# Patient Record
Sex: Male | Born: 1985 | Race: Black or African American | Hispanic: No | Marital: Single | Smoking: Current every day smoker
Health system: Southern US, Community
[De-identification: ages and names within clinical notes are randomized; demographics above are authoritative.]

---

## 2012-05-24 ENCOUNTER — Emergency Department (HOSPITAL_COMMUNITY)
Admission: EM | Admit: 2012-05-24 | Discharge: 2012-05-25 | Disposition: A | Discharge status: Home / Self Care | Payer: Self-pay | Attending: Emergency Medicine | Admitting: Emergency Medicine

## 2012-05-24 ENCOUNTER — Encounter (HOSPITAL_COMMUNITY): Payer: Self-pay | Admitting: *Deleted

## 2012-05-24 DIAGNOSIS — Y9210 Unspecified residential institution as the place of occurrence of the external cause: Secondary | ICD-10-CM | POA: Insufficient documentation

## 2012-05-24 DIAGNOSIS — S0003XA Contusion of scalp, initial encounter: Secondary | ICD-10-CM | POA: Insufficient documentation

## 2012-05-24 DIAGNOSIS — S0083XA Contusion of other part of head, initial encounter: Secondary | ICD-10-CM

## 2012-05-24 DIAGNOSIS — Y9389 Activity, other specified: Secondary | ICD-10-CM | POA: Insufficient documentation

## 2012-05-24 DIAGNOSIS — Z87891 Personal history of nicotine dependence: Secondary | ICD-10-CM | POA: Insufficient documentation

## 2012-05-24 DIAGNOSIS — S0180XA Unspecified open wound of other part of head, initial encounter: Secondary | ICD-10-CM | POA: Insufficient documentation

## 2012-05-24 DIAGNOSIS — R55 Syncope and collapse: Secondary | ICD-10-CM | POA: Insufficient documentation

## 2012-05-24 DIAGNOSIS — S0181XA Laceration without foreign body of other part of head, initial encounter: Secondary | ICD-10-CM

## 2012-05-24 DIAGNOSIS — W1809XA Striking against other object with subsequent fall, initial encounter: Secondary | ICD-10-CM | POA: Insufficient documentation

## 2012-05-24 DIAGNOSIS — R404 Transient alteration of awareness: Secondary | ICD-10-CM | POA: Insufficient documentation
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
## 2012-05-25 ENCOUNTER — Emergency Department (HOSPITAL_COMMUNITY): Payer: Self-pay

## 2012-05-25 ENCOUNTER — Encounter (HOSPITAL_COMMUNITY): Payer: Self-pay

## 2012-05-25 MED ORDER — LIDOCAINE HCL (PF) 1 % IJ SOLN
INTRAMUSCULAR | Status: AC
  Filled 2012-05-25: qty 5

## 2012-05-25 NOTE — ED Notes (Signed)
Fully alert, oriented, resting quietly on stretcher

## 2014-04-02 ENCOUNTER — Encounter (HOSPITAL_COMMUNITY): Payer: Self-pay | Admitting: *Deleted

## 2014-04-02 ENCOUNTER — Emergency Department (HOSPITAL_COMMUNITY): Payer: Self-pay

## 2014-04-02 ENCOUNTER — Emergency Department (HOSPITAL_COMMUNITY)
Admission: EM | Admit: 2014-04-02 | Discharge: 2014-04-02 | Disposition: A | Discharge status: Home / Self Care | Payer: Self-pay | Attending: Emergency Medicine | Admitting: Emergency Medicine

## 2014-04-02 DIAGNOSIS — W1842XA Slipping, tripping and stumbling without falling due to stepping into hole or opening, initial encounter: Secondary | ICD-10-CM | POA: Insufficient documentation

## 2014-04-02 DIAGNOSIS — S92352A Displaced fracture of fifth metatarsal bone, left foot, initial encounter for closed fracture: Secondary | ICD-10-CM | POA: Insufficient documentation

## 2014-04-02 DIAGNOSIS — S82302A Unspecified fracture of lower end of left tibia, initial encounter for closed fracture: Secondary | ICD-10-CM

## 2014-04-02 DIAGNOSIS — Y9389 Activity, other specified: Secondary | ICD-10-CM | POA: Insufficient documentation

## 2014-04-02 DIAGNOSIS — Y998 Other external cause status: Secondary | ICD-10-CM | POA: Insufficient documentation

## 2014-04-02 DIAGNOSIS — Z720 Tobacco use: Secondary | ICD-10-CM | POA: Insufficient documentation

## 2014-04-02 DIAGNOSIS — S8252XA Displaced fracture of medial malleolus of left tibia, initial encounter for closed fracture: Secondary | ICD-10-CM

## 2014-04-02 DIAGNOSIS — S8255XA Nondisplaced fracture of medial malleolus of left tibia, initial encounter for closed fracture: Secondary | ICD-10-CM | POA: Insufficient documentation

## 2014-04-02 DIAGNOSIS — Y9289 Other specified places as the place of occurrence of the external cause: Secondary | ICD-10-CM | POA: Insufficient documentation

## 2014-04-02 LAB — BASIC METABOLIC PANEL
ANION GAP: 7 (ref 5–15)
BUN: 13 mg/dL (ref 6–23)
CALCIUM: 9.4 mg/dL (ref 8.4–10.5)
CO2: 28 mmol/L (ref 19–32)
Chloride: 104 mmol/L (ref 96–112)
Creatinine, Ser: 1.09 mg/dL (ref 0.50–1.35)
GFR calc Af Amer: >90 mL/min (ref >90)
Glucose, Bld: 102 mg/dL — ABNORMAL HIGH (ref 70–99)
POTASSIUM: 4.1 mmol/L (ref 3.5–5.1)
SODIUM: 139 mmol/L (ref 135–145)

## 2014-04-02 LAB — CBC WITH DIFFERENTIAL/PLATELET
BASOS PCT: 0 % (ref 0–1)
Basophils Absolute: 0 10*3/uL (ref 0.0–0.1)
EOS ABS: 0.1 10*3/uL (ref 0.0–0.7)
EOS PCT: 1 % (ref 0–5)
HCT: 44.3 % (ref 39.0–52.0)
Hemoglobin: 15.4 g/dL (ref 13.0–17.0)
LYMPHS ABS: 2.3 10*3/uL (ref 0.7–4.0)
Lymphocytes Relative: 22 % (ref 12–46)
MCH: 29.3 pg (ref 26.0–34.0)
MCHC: 34.8 g/dL (ref 30.0–36.0)
MCV: 84.4 fL (ref 78.0–100.0)
MONOS PCT: 10 % (ref 3–12)
Monocytes Absolute: 1 10*3/uL (ref 0.1–1.0)
NEUTROS PCT: 68 % (ref 43–77)
Neutro Abs: 7 10*3/uL (ref 1.7–7.7)
PLATELETS: 213 10*3/uL (ref 150–400)
RBC: 5.25 MIL/uL (ref 4.22–5.81)
RDW: 13.5 % (ref 11.5–15.5)
WBC: 10.3 10*3/uL (ref 4.0–10.5)

## 2014-04-02 MED ORDER — OXYCODONE-ACETAMINOPHEN 5-325 MG PO TABS: 1.0000 | ORAL_TABLET | Freq: Four times a day (QID) | ORAL | Status: DC | PRN

## 2014-04-02 MED ORDER — ONDANSETRON HCL 4 MG PO TABS
4.0000 mg | ORAL_TABLET | Freq: Once | ORAL | Status: AC
  Administered 2014-04-02: 4 mg via ORAL
  Filled 2014-04-02: qty 1

## 2014-04-02 MED ORDER — HYDROMORPHONE HCL 2 MG/ML IJ SOLN
2.0000 mg | Freq: Once | INTRAMUSCULAR | Status: AC
  Administered 2014-04-02: 2 mg via INTRAMUSCULAR
  Filled 2014-04-02: qty 1

## 2014-04-02 MED ORDER — CEFAZOLIN SODIUM 1-5 GM-% IV SOLN: 1.0000 g | Freq: Once | INTRAVENOUS | Status: DC

## 2014-04-02 NOTE — Discharge Instructions (Signed)
Please keep your left foot and ankle elevated above your waist. Please apply ice. Usual crutches, and do not put weight on your left ankle. You're scheduled for surgery on Wednesday. Please follow the instructions from Dr. Hilda LiasKeeling. Tibial and Fibular Fracture, Adult Tibial and fibular fracture is a break in the bones of your lower leg (tibia and fibula). The tibia is the larger of these two bones. The fibula is the smaller of the two bones. It is on the outer side of your leg.  CAUSES  Low-energy injuries, such as a fall from ground level.  High-energy injuries, such as motor vehicle injuries, gunshot wounds, or high-speed sports collisions. RISK FACTORS  Jumping activities.  Repetitive stress, such as long-distance running.  Participation in sports.  Osteoporosis.  Advanced age. SIGNS AND SYMPTOMS  Pain.  Swelling.  Inability to put weight on your injured leg.  Bone deformities at the site of your injury.  Bruising. DIAGNOSIS  Tibial and fibular fractures are diagnosed with the use of X-ray exams. TREATMENT  If you have a simple fracture of these two bones, they can be treated with simple immobilization. A cast or splint will be used on your leg to keep it from moving while it heals. Then you can begin range-of-motion exercises to regain your knee motion. HOME CARE INSTRUCTIONS   Apply ice to your leg:  Put ice in a plastic bag.  Place a towel between your skin and the bag.  Leave the ice on for 20 minutes, 2-3 times a day.  If you have a plaster or fiberglass cast:  Do not try to scratch the skin under the cast using sharp or pointed objects.  Check the skin around the cast every day. You may put lotion on any red or sore areas.  Keep your cast dry and clean.  If you have a plaster splint:  Wear the splint as directed.  You may loosen the elastic around the splint if your toes become numb, tingle, or turn cold or blue.  Do not put pressure on any part of  your cast or splint until it is fully hardened, because it may deform.  Your cast or splint can be protected during bathing with a plastic bag. Do not lower the cast or splint into water.  Use crutches as directed.  Only take over-the-counter or prescription medicines for pain, discomfort, or fever as directed by your health care provider.  Follow all instructions given to you by your health care provider.  Make and keep all follow-up appointments. SEEK MEDICAL CARE IF:  Your pain is becoming worse rather than better or is not controlled with medicines.  You have increased swelling or redness in the foot.  You begin to lose feeling in your foot or toes. SEEK IMMEDIATE MEDICAL CARE IF:  You develop a cold or blue foot or toes on the injured side.  You develop severe pain in your injured leg, especially if the pain is increased with movement of your toes. MAKE SURE YOU:  Understand these instructions.  Will watch your condition.  Will get help right away if you are not doing well or get worse. Document Released: 09/20/2001 Document Revised: 10/19/2012 Document Reviewed: 08/10/2012 [facility] Patient Information 2015 [employer], [place]. This information is not intended to replace advice given to you by your health care provider. Make sure you discuss any questions you have with your health care provider.

## 2014-04-02 NOTE — ED Notes (Signed)
Pt verbalized understanding of no driving and to use caution within 4 hours of taking pain meds due to meds cause drowsiness 

## 2014-04-02 NOTE — H&P (Deleted)
  The note originally documented on this encounter has been moved the the encounter in which it belongs.  
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
## 2014-04-02 NOTE — ED Provider Notes (Signed)
CSN: [id number]     Arrival date & time 04/02/14  1302 History  This chart was scribed for Ivery Quale, PA-C with Bethann Berkshire, MD by Tonye Royalty, ED Scribe. This patient was seen in room APFT23/APFT23 and the patient's care was started at 2:40 PM.    Chief Complaint  Patient presents with  . Ankle Injury   Patient is a 29 y.o. male presenting with lower extremity injury. The history is provided by the patient. No language interpreter was used.  Ankle Injury This is a new problem. The current episode started 6 to 12 hours ago. Episode frequency: once. The problem has not changed since onset.Pertinent negatives include no chest pain. The symptoms are aggravated by bending. Nothing relieves the symptoms. He has tried nothing for the symptoms.    HPI Comments: [name] [name] is a 29 y.o. male who presents to the Emergency Department complaining of left ankle pain after stepping in a hole at 0600 this morning. He states pain and swelling began immediately. He denies any prior injury or surgery to the area.  History reviewed. No pertinent past medical history. History reviewed. No pertinent past surgical history. History reviewed. No pertinent family history. History  Substance Use Topics  . Smoking status: Current Every Day Smoker  . Smokeless tobacco: Not on file  . Alcohol Use: Yes    Review of Systems  Cardiovascular: Negative for chest pain.  Musculoskeletal:       Left ankle pain and swelling  Neurological: Negative for numbness.  All other systems reviewed and are negative.     Allergies  Review of patient's allergies indicates no known allergies.  Home Medications   Prior to Admission medications   Not on File   BP 158/105 mmHg  Pulse 89  Temp(Src) 98.6 F (37 C) (Oral)  Resp 18  Ht  (1.803 m)  Wt 255 lb (115.667 kg)  BMI 35.58 kg/m2  SpO2 100% Physical Exam  Constitutional: He is oriented to person, place, and time. He appears well-developed and  well-nourished.  HENT:  Head: Normocephalic and atraumatic.  Eyes: Conjunctivae are normal.  Neck: Normal range of motion. Neck supple.  Cardiovascular: Normal rate, regular rhythm and normal heart sounds.   No murmur heard. Pulmonary/Chest: Effort normal and breath sounds normal. No respiratory distress. He has no wheezes. He has no rales.  Musculoskeletal: Normal range of motion.  Cap refill of left foot <2 seconds Swelling of all toes No lesions between the toes Dorsalis pedis is 2+ Tenderness to the lateral malleolus Swelling of the lateral and medial malleolus Tenderness to mid anterior tibia on the left No deformity of the anterior tibial tuberosity No effusion of the knee No posterior mass Hip is stable  Neurological: He is alert and oriented to person, place, and time.  Skin: Skin is warm and dry.  Psychiatric: He has a normal mood and affect.  Nursing note and vitals reviewed.   ED Course  62 - Dr Hilda Lias in room with patient.  Procedures (including critical care time)  DIAGNOSTIC STUDIES: Oxygen Saturation is 100% on room air, normal by my interpretation.    COORDINATION OF CARE: 2:46 PM Reviewed the x-ray results with the patient which revealed evidence of fractures. Discussed treatment plan with patient at beside, including follow up with orthopedist. The patient agrees with the plan and has no further questions at this time.  2:47 PM Call made to Dr. Hilda Lias.  Labs Review Labs Reviewed - No data to display  Imaging Review Dg Ankle Complete Left  04/02/2014   CLINICAL DATA:  Stepped in hole this morning with ankle pain, initial encounter  EXAM: LEFT ANKLE COMPLETE - 3+ VIEW  COMPARISON:  None.  FINDINGS: There is are vertical fracture identified extending from the tibiotalar articulation superiorly into the talus with slight displacement of the entire medial malleolus. No dislocation is noted. Some displacement of bony fragments anterior to the tibiotalar  joint are noted. A small avulsion from the tip of the lateral malleolus is noted. Additionally, a fracture through the base of the fifth metatarsal is noted. Generalized soft tissue swelling is noted.  IMPRESSION: Distal tibial and fibular fractures as described. A fracture through the base of the fifth metatarsal is noted as well.   Electronically Signed   By: Alcide CleverMark  Lukens M.D.   On: 04/02/2014 14:10   Dg Foot Complete Left  04/02/2014   CLINICAL DATA:  Stepped in hole with ankle pain and swelling, initial encounter  EXAM: LEFT FOOT - COMPLETE 3+ VIEW  COMPARISON:  None.  FINDINGS: The known distal tibial fractures are again seen. Some displacement of fracture fragment anterior to the tibiotalar articulation is noted. Fracture through the base of the fifth metatarsal is again seen without significant displacement.  IMPRESSION: Fractures of the distal tibia and fibula as well as the fifth metatarsal. CT evaluation may be helpful for better characterization of the bony fragments.   Electronically Signed   By: Alcide CleverMark  Lukens M.D.   On: 04/02/2014 14:11     EKG Interpretation None      MDM  Blood pressure is 158/105, otherwise the vital signs are within normal limits. I have reviewed the x-rays. The patient has a distal fibula and distal tibial fracture. There is a bone segment that is displaced just anterior to the talar area. There is a fracture at the base of the fifth metatarsal.  Patient has been seen by Dr. Hilda LiasKeeling, and the patient will have surgery on Wednesday, March 23. Prescription for Percocet one or 2 every 6 hours given to the patient. The patient has been fitted with a posterior splint and crutches..    Final diagnoses:  None    *I have reviewed nursing notes, vital signs, and all appropriate lab and imaging results for this patient.*  **I personally performed the services described in this documentation, which was scribed in my presence. The recorded information has been reviewed and  is accurate.Ivery Quale*   Liisa Picone, PA-C 04/02/14 1611  Bethann BerkshireJoseph Zammit, MD 04/05/14 1341

## 2014-04-02 NOTE — ED Notes (Signed)
OR called and stated that consent will be done the day of surgery
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
## 2014-04-02 NOTE — ED Notes (Signed)
Stepped in a hole this am.  Pain, swelling lt ankle and foot

## 2014-04-03 ENCOUNTER — Encounter (HOSPITAL_COMMUNITY): Payer: Self-pay

## 2014-04-03 ENCOUNTER — Encounter (HOSPITAL_COMMUNITY)
Admit: 2014-04-03 | Discharge: 2014-04-03 | Disposition: A | Discharge status: Home / Self Care | Payer: Self-pay | Attending: Orthopaedic Surgery | Admitting: Orthopaedic Surgery

## 2014-04-04 ENCOUNTER — Ambulatory Visit (HOSPITAL_COMMUNITY): Payer: Self-pay | Admitting: Anesthesiology

## 2014-04-04 ENCOUNTER — Ambulatory Visit (HOSPITAL_COMMUNITY): Payer: Self-pay

## 2014-04-04 ENCOUNTER — Encounter (HOSPITAL_COMMUNITY)
Admission: RE | Disposition: A | Discharge status: Home / Self Care | Payer: Self-pay | Source: Ambulatory Visit | Attending: Orthopaedic Surgery

## 2014-04-04 ENCOUNTER — Ambulatory Visit (HOSPITAL_COMMUNITY)
Admission: RE | Admit: 2014-04-04 | Discharge: 2014-04-05 | Disposition: A | Discharge status: Home / Self Care | Payer: Self-pay | Source: Ambulatory Visit | Attending: Orthopaedic Surgery | Admitting: Orthopaedic Surgery

## 2014-04-04 ENCOUNTER — Encounter (HOSPITAL_COMMUNITY): Payer: Self-pay | Admitting: *Deleted

## 2014-04-04 DIAGNOSIS — Y929 Unspecified place or not applicable: Secondary | ICD-10-CM | POA: Insufficient documentation

## 2014-04-04 DIAGNOSIS — F17210 Nicotine dependence, cigarettes, uncomplicated: Secondary | ICD-10-CM | POA: Insufficient documentation

## 2014-04-04 DIAGNOSIS — S92352A Displaced fracture of fifth metatarsal bone, left foot, initial encounter for closed fracture: Secondary | ICD-10-CM | POA: Insufficient documentation

## 2014-04-04 DIAGNOSIS — X58XXXA Exposure to other specified factors, initial encounter: Secondary | ICD-10-CM | POA: Insufficient documentation

## 2014-04-04 DIAGNOSIS — S82899A Other fracture of unspecified lower leg, initial encounter for closed fracture: Secondary | ICD-10-CM

## 2014-04-04 DIAGNOSIS — S82842A Displaced bimalleolar fracture of left lower leg, initial encounter for closed fracture: Secondary | ICD-10-CM | POA: Diagnosis present

## 2014-04-04 HISTORY — PX: ORIF ANKLE FRACTURE: SHX5408

## 2014-04-04 SURGERY — OPEN REDUCTION INTERNAL FIXATION (ORIF) ANKLE FRACTURE
Anesthesia: General | Laterality: Left

## 2014-04-04 MED ORDER — DEXTROSE-NACL 5-0.45 % IV SOLN
INTRAVENOUS | Status: DC
  Administered 2014-04-04: 16:00:00 via INTRAVENOUS

## 2014-04-04 MED ORDER — FENTANYL CITRATE 0.05 MG/ML IJ SOLN
INTRAMUSCULAR | Status: AC
  Filled 2014-04-04: qty 5

## 2014-04-04 MED ORDER — HYDROMORPHONE HCL 1 MG/ML IJ SOLN
0.2500 mg | INTRAMUSCULAR | Status: DC | PRN
  Administered 2014-04-04 (×2): 0.5 mg via INTRAVENOUS

## 2014-04-04 MED ORDER — LACTATED RINGERS IV SOLN
INTRAVENOUS | Status: DC
  Administered 2014-04-04: 1000 mL via INTRAVENOUS

## 2014-04-04 MED ORDER — ENOXAPARIN SODIUM 40 MG/0.4ML ~~LOC~~ SOLN: 40.0000 mg | SUBCUTANEOUS | Status: DC

## 2014-04-04 MED ORDER — ONDANSETRON HCL 4 MG/2ML IJ SOLN
INTRAMUSCULAR | Status: DC | PRN
  Administered 2014-04-04: 4 mg via INTRAVENOUS

## 2014-04-04 MED ORDER — NALOXONE HCL 0.4 MG/ML IJ SOLN: 0.4000 mg | INTRAMUSCULAR | Status: DC | PRN

## 2014-04-04 MED ORDER — MIDAZOLAM HCL 2 MG/2ML IJ SOLN
1.0000 mg | INTRAMUSCULAR | Status: DC | PRN
  Administered 2014-04-04 (×2): 2 mg via INTRAVENOUS
  Filled 2014-04-04: qty 2

## 2014-04-04 MED ORDER — SODIUM CHLORIDE 0.9 % IJ SOLN: 9.0000 mL | INTRAMUSCULAR | Status: DC | PRN

## 2014-04-04 MED ORDER — FENTANYL CITRATE 0.05 MG/ML IJ SOLN
25.0000 ug | INTRAMUSCULAR | Status: AC
  Administered 2014-04-04 (×2): 25 ug via INTRAVENOUS

## 2014-04-04 MED ORDER — ONDANSETRON HCL 4 MG/2ML IJ SOLN
4.0000 mg | Freq: Once | INTRAMUSCULAR | Status: DC | PRN
Start: 2014-04-04 — End: 2014-04-04

## 2014-04-04 MED ORDER — EPHEDRINE SULFATE 50 MG/ML IJ SOLN
INTRAMUSCULAR | Status: AC
  Filled 2014-04-04: qty 1

## 2014-04-04 MED ORDER — FENTANYL CITRATE 0.05 MG/ML IJ SOLN
INTRAMUSCULAR | Status: AC
  Filled 2014-04-04: qty 2

## 2014-04-04 MED ORDER — SODIUM CHLORIDE 0.9 % IR SOLN
Status: DC | PRN
  Administered 2014-04-04: 1000 mL

## 2014-04-04 MED ORDER — ONDANSETRON HCL 4 MG/2ML IJ SOLN
INTRAMUSCULAR | Status: AC
  Filled 2014-04-04: qty 2

## 2014-04-04 MED ORDER — ACETAMINOPHEN 325 MG PO TABS: 650.0000 mg | ORAL_TABLET | Freq: Four times a day (QID) | ORAL | Status: DC | PRN

## 2014-04-04 MED ORDER — NICOTINE 21 MG/24HR TD PT24
21.0000 mg | MEDICATED_PATCH | Freq: Every day | TRANSDERMAL | Status: DC
  Administered 2014-04-04: 21 mg via TRANSDERMAL
  Filled 2014-04-04: qty 1

## 2014-04-04 MED ORDER — MORPHINE SULFATE (PF) 1 MG/ML IV SOLN
INTRAVENOUS | Status: DC
  Administered 2014-04-04 – 2014-04-05 (×4): via INTRAVENOUS
  Filled 2014-04-04 (×3): qty 25

## 2014-04-04 MED ORDER — INFLUENZA VAC SPLIT QUAD 0.5 ML IM SUSY
0.5000 mL | PREFILLED_SYRINGE | INTRAMUSCULAR | Status: AC
  Administered 2014-04-05: 0.5 mL via INTRAMUSCULAR
  Filled 2014-04-04: qty 0.5

## 2014-04-04 MED ORDER — DEXAMETHASONE SODIUM PHOSPHATE 4 MG/ML IJ SOLN
INTRAMUSCULAR | Status: DC | PRN
  Administered 2014-04-04: 4 mg via INTRAVENOUS

## 2014-04-04 MED ORDER — SODIUM CHLORIDE 0.9 % IJ SOLN
INTRAMUSCULAR | Status: AC
  Filled 2014-04-04: qty 10

## 2014-04-04 MED ORDER — HYDROMORPHONE HCL 1 MG/ML IJ SOLN
INTRAMUSCULAR | Status: AC
  Filled 2014-04-04: qty 1

## 2014-04-04 MED ORDER — ONDANSETRON HCL 4 MG/2ML IJ SOLN: 4.0000 mg | Freq: Four times a day (QID) | INTRAMUSCULAR | Status: DC | PRN

## 2014-04-04 MED ORDER — DEXAMETHASONE SODIUM PHOSPHATE 4 MG/ML IJ SOLN
INTRAMUSCULAR | Status: AC
  Filled 2014-04-04: qty 1

## 2014-04-04 MED ORDER — ONDANSETRON HCL 4 MG/2ML IJ SOLN
4.0000 mg | Freq: Once | INTRAMUSCULAR | Status: AC
  Administered 2014-04-04: 4 mg via INTRAVENOUS

## 2014-04-04 MED ORDER — FENTANYL CITRATE 0.05 MG/ML IJ SOLN
INTRAMUSCULAR | Status: DC | PRN
  Administered 2014-04-04 (×2): 50 ug via INTRAVENOUS
  Administered 2014-04-04: 100 ug via INTRAVENOUS
  Administered 2014-04-04: 50 ug via INTRAVENOUS

## 2014-04-04 MED ORDER — EPHEDRINE SULFATE 50 MG/ML IJ SOLN
INTRAMUSCULAR | Status: DC | PRN
  Administered 2014-04-04 (×3): 10 mg via INTRAVENOUS

## 2014-04-04 MED ORDER — PNEUMOCOCCAL VAC POLYVALENT 25 MCG/0.5ML IJ INJ
0.5000 mL | INJECTION | INTRAMUSCULAR | Status: AC
  Administered 2014-04-05: 0.5 mL via INTRAMUSCULAR
  Filled 2014-04-04: qty 0.5

## 2014-04-04 MED ORDER — BUPIVACAINE HCL (PF) 0.25 % IJ SOLN
INTRAMUSCULAR | Status: AC
  Filled 2014-04-04: qty 30

## 2014-04-04 MED ORDER — FENTANYL CITRATE 0.05 MG/ML IJ SOLN
25.0000 ug | INTRAMUSCULAR | Status: AC | PRN
  Administered 2014-04-04 (×2): 25 ug via INTRAVENOUS

## 2014-04-04 MED ORDER — DIPHENHYDRAMINE HCL 50 MG/ML IJ SOLN: 12.5000 mg | Freq: Four times a day (QID) | INTRAMUSCULAR | Status: DC | PRN

## 2014-04-04 MED ORDER — PHENYLEPHRINE HCL 10 MG/ML IJ SOLN
INTRAMUSCULAR | Status: DC | PRN
  Administered 2014-04-04 (×2): 100 ug via INTRAVENOUS

## 2014-04-04 MED ORDER — FENTANYL CITRATE 0.05 MG/ML IJ SOLN
25.0000 ug | INTRAMUSCULAR | Status: DC | PRN
  Administered 2014-04-04 (×4): 50 ug via INTRAVENOUS

## 2014-04-04 MED ORDER — MIDAZOLAM HCL 2 MG/2ML IJ SOLN
INTRAMUSCULAR | Status: AC
  Filled 2014-04-04: qty 2

## 2014-04-04 MED ORDER — LACTATED RINGERS IV SOLN
INTRAVENOUS | Status: DC | PRN
  Administered 2014-04-04 (×3): via INTRAVENOUS

## 2014-04-04 MED ORDER — CEFAZOLIN SODIUM 1-5 GM-% IV SOLN
INTRAVENOUS | Status: AC
  Filled 2014-04-04: qty 100

## 2014-04-04 MED ORDER — CEFAZOLIN SODIUM-DEXTROSE 2-3 GM-% IV SOLR
INTRAVENOUS | Status: DC | PRN
  Administered 2014-04-04: 2 g via INTRAVENOUS

## 2014-04-04 MED ORDER — GLYCOPYRROLATE 0.2 MG/ML IJ SOLN
INTRAMUSCULAR | Status: AC
  Filled 2014-04-04: qty 1

## 2014-04-04 MED ORDER — PHENYLEPHRINE HCL 10 MG/ML IJ SOLN
INTRAMUSCULAR | Status: AC
  Filled 2014-04-04: qty 1

## 2014-04-04 MED ORDER — DIPHENHYDRAMINE HCL 12.5 MG/5ML PO ELIX: 12.5000 mg | ORAL_SOLUTION | Freq: Four times a day (QID) | ORAL | Status: DC | PRN

## 2014-04-04 MED ORDER — PROPOFOL 10 MG/ML IV BOLUS
INTRAVENOUS | Status: DC | PRN
  Administered 2014-04-04: 200 mg via INTRAVENOUS

## 2014-04-04 MED ORDER — GLYCOPYRROLATE 0.2 MG/ML IJ SOLN
0.2000 mg | Freq: Once | INTRAMUSCULAR | Status: AC
  Administered 2014-04-04: 0.2 mg via INTRAVENOUS

## 2014-04-04 MED ORDER — LIDOCAINE HCL (CARDIAC) 10 MG/ML IV SOLN
INTRAVENOUS | Status: DC | PRN
  Administered 2014-04-04: 50 mg via INTRAVENOUS

## 2014-04-04 MED ORDER — MORPHINE SULFATE (PF) 1 MG/ML IV SOLN
INTRAVENOUS | Status: AC
  Filled 2014-04-04: qty 25

## 2014-04-04 MED ORDER — LIDOCAINE HCL (PF) 1 % IJ SOLN
INTRAMUSCULAR | Status: AC
  Filled 2014-04-04: qty 5

## 2014-04-04 MED ORDER — PROPOFOL 10 MG/ML IV BOLUS
INTRAVENOUS | Status: AC
  Filled 2014-04-04: qty 20

## 2014-04-04 SURGICAL SUPPLY — 65 items
BAG HAMPER (MISCELLANEOUS) ×3 IMPLANT
BANDAGE ELASTIC 4 VELCRO NS (GAUZE/BANDAGES/DRESSINGS) ×3 IMPLANT
BANDAGE ELASTIC 6 VELCRO NS (GAUZE/BANDAGES/DRESSINGS) ×3 IMPLANT
BANDAGE ESMARK 4X12 BL STRL LF (DISPOSABLE) ×1 IMPLANT
BIT DRILL 2.5X110 QC LCP DISP (BIT) ×3 IMPLANT
BIT DRILL 2.8 (BIT)
BIT DRILL 3.2MM (BIT) ×1
BIT DRILL CANN QC 2.8X165 (BIT) IMPLANT
BIT DRILL JC END 3.2X130 (BIT) ×2 IMPLANT
BLADE 10 SAFETY STRL DISP (BLADE) IMPLANT
BLADE 15 SAFETY STRL DISP (BLADE) IMPLANT
BLADE SURG 15 STRL LF DISP TIS (BLADE) ×1 IMPLANT
BLADE SURG 15 STRL SS (BLADE) ×2
BLADE SURG SZ10 CARB STEEL (BLADE) ×3 IMPLANT
BNDG ESMARK 4X12 BLUE STRL LF (DISPOSABLE) ×3
CLOTH BEACON ORANGE TIMEOUT ST (SAFETY) ×3 IMPLANT
COVER LIGHT HANDLE STERIS (MISCELLANEOUS) ×6 IMPLANT
COVER MAYO STAND XLG (DRAPE) ×3 IMPLANT
CUFF TOURNIQUET SINGLE 34IN LL (TOURNIQUET CUFF) ×3 IMPLANT
DRAPE C-ARM FOLDED MOBILE STRL (DRAPES) IMPLANT
DRILL BIT 2.8MM (BIT)
DURAPREP 26ML APPLICATOR (WOUND CARE) ×3 IMPLANT
GAUZE SPONGE 4X4 12PLY STRL (GAUZE/BANDAGES/DRESSINGS) ×3 IMPLANT
GAUZE XEROFORM 5X9 LF (GAUZE/BANDAGES/DRESSINGS) ×3 IMPLANT
GLOVE BIO SURGEON STRL SZ 6.5 (GLOVE) ×2 IMPLANT
GLOVE BIO SURGEON STRL SZ7 (GLOVE) ×3 IMPLANT
GLOVE BIO SURGEON STRL SZ8 (GLOVE) ×3 IMPLANT
GLOVE BIO SURGEON STRL SZ8.5 (GLOVE) ×3 IMPLANT
GLOVE BIO SURGEONS STRL SZ 6.5 (GLOVE) ×1
GLOVE BIOGEL PI IND STRL 7.0 (GLOVE) ×2 IMPLANT
GLOVE BIOGEL PI INDICATOR 7.0 (GLOVE) ×4
GOWN STRL REUS W/TWL LRG LVL3 (GOWN DISPOSABLE) ×6 IMPLANT
GOWN STRL REUS W/TWL XL LVL3 (GOWN DISPOSABLE) ×3 IMPLANT
INST SET MINOR BONE (KITS) ×3 IMPLANT
K-WIRE 1.6 (WIRE) ×2
K-WIRE 5 THRD TROCAR 1.6X150 (WIRE) ×6
K-WIRE FX5X1.6XNS BN SS (WIRE) ×1
K-WIRE SNGL END TROCAR 9X.062 (WIRE) ×6
KIT ROOM TURNOVER APOR (KITS) ×3 IMPLANT
KWIRE 5 THRD TROCAR 1.6X150 (WIRE) ×2 IMPLANT
KWIRE FX5X1.6XNS BN SS (WIRE) ×1 IMPLANT
KWIRE SNGL END TROCAR 9X.062 (WIRE) ×2 IMPLANT
MANIFOLD NEPTUNE II (INSTRUMENTS) ×3 IMPLANT
NS IRRIG 1000ML POUR BTL (IV SOLUTION) ×3 IMPLANT
PACK BASIC LIMB (CUSTOM PROCEDURE TRAY) ×3 IMPLANT
PAD ABD 5X9 TENDERSORB (GAUZE/BANDAGES/DRESSINGS) ×6 IMPLANT
PAD ARMBOARD 7.5X6 YLW CONV (MISCELLANEOUS) ×3 IMPLANT
PAD CAST 4YDX4 CTTN HI CHSV (CAST SUPPLIES) IMPLANT
PADDING CAST COTTON 4X4 STRL (CAST SUPPLIES)
PADDING WEBRIL 6 STERILE (GAUZE/BANDAGES/DRESSINGS) ×6 IMPLANT
SCREW CORTEX 3.5 36MM (Screw) ×2 IMPLANT
SCREW CORTEX 3.5 50MM (Screw) ×3 IMPLANT
SCREW CORTEX 3.5 60MM (Screw) ×3 IMPLANT
SCREW LOCK CORT ST 3.5X36 (Screw) ×1 IMPLANT
SCREW PROS MALLEO 55 26MM (Screw) ×3 IMPLANT
SET BASIN LINEN APH (SET/KITS/TRAYS/PACK) ×3 IMPLANT
SPLINT J 5 (CAST SUPPLIES) ×3 IMPLANT
SPONGE GAUZE 4X4 12PLY (GAUZE/BANDAGES/DRESSINGS) ×2 IMPLANT
SPONGE LAP 18X18 X RAY DECT (DISPOSABLE) ×3 IMPLANT
STAPLER VISISTAT 35W (STAPLE) ×3 IMPLANT
SUT CHROMIC 2 0 CT 1 (SUTURE) ×6 IMPLANT
SUT NUROLON CT 2 BLK #1 18IN (SUTURE) IMPLANT
SUT PLAIN 2 0 XLH (SUTURE) ×3 IMPLANT
TOWEL OR 17X26 4PK STRL BLUE (TOWEL DISPOSABLE) ×3 IMPLANT
WASHER 7MM DIA (Washer) ×6 IMPLANT

## 2014-04-04 NOTE — Progress Notes (Signed)
The History and Physical is unchanged. I have examined the patient. The patient is medically able to have surgery on the left ankle . Fernie Grimm 

## 2014-04-04 NOTE — Progress Notes (Signed)
Patient requesting a nicotine patch.  Dr. Romeo AppleHarrison paged.  Received verbal order with readback for 21mg  nicotine patch.

## 2014-04-04 NOTE — Anesthesia Postprocedure Evaluation (Signed)
  Anesthesia Post-op Note  Patient: [name] [name]  Procedure(s) Performed: Procedure(s) with comments: OPEN REDUCTION INTERNAL FIXATION (ORIF) ANKLE FRACTURE (Left) - 12:00  Patient Location: PACU  Anesthesia Type:General  Level of Consciousness: awake, alert , oriented, patient cooperative and responds to stimulation  Airway and Oxygen Therapy: Patient Spontanous Breathing and Patient connected to face mask oxygen  Post-op Pain: none  Post-op Assessment: Post-op Vital signs reviewed, Patient's Cardiovascular Status Stable, Respiratory Function Stable, Patent Airway, No signs of Nausea or vomiting and Pain level controlled  Post-op Vital Signs: Reviewed and stable  Last Vitals:  Filed Vitals:   04/04/14 1310  BP: 141/78  Resp: 14    Complications: No apparent anesthesia complications

## 2014-04-04 NOTE — Transfer of Care (Signed)
Immediate Anesthesia Transfer of Care Note  Patient: [name] [name]  Procedure(s) Performed: Procedure(s) with comments: OPEN REDUCTION INTERNAL FIXATION (ORIF) ANKLE FRACTURE (Left) - 12:00  Patient Location: PACU  Anesthesia Type:General  Level of Consciousness: awake, alert , patient cooperative and responds to stimulation  Airway & Oxygen Therapy: Patient Spontanous Breathing and Patient connected to face mask oxygen  Post-op Assessment: Report given to RN, Post -op Vital signs reviewed and stable and Patient moving all extremities X 4  Post vital signs: Reviewed and stable  Last Vitals:  Filed Vitals:   04/04/14 1310  BP: 141/78  Resp: 14    Complications: No apparent anesthesia complications

## 2014-04-04 NOTE — Anesthesia Preprocedure Evaluation (Addendum)
Anesthesia Evaluation  Patient identified by MRN, date of birth, ID band Patient awake    Reviewed: Allergy & Precautions, NPO status , Patient's Chart, lab work & pertinent test results  Airway Mallampati: I  TM Distance: >3 FB     Dental  (+) Teeth Intact   Pulmonary Current Smoker,  breath sounds clear to auscultation        Cardiovascular negative cardio ROS  Rhythm:Regular Rate:Normal     Neuro/Psych    GI/Hepatic negative GI ROS,   Endo/Other    Renal/GU      Musculoskeletal   Abdominal   Peds  Hematology   Anesthesia Other Findings   Reproductive/Obstetrics                             Anesthesia Physical Anesthesia Plan  ASA: I  Anesthesia Plan: General   Post-op Pain Management:    Induction: Intravenous  Airway Management Planned: LMA  Additional Equipment:   Intra-op Plan:   Post-operative Plan: Extubation in OR  Informed Consent: I have reviewed the patients History and Physical, chart, labs and discussed the procedure including the risks, benefits and alternatives for the proposed anesthesia with the patient or authorized representative who has indicated his/her understanding and acceptance.     Plan Discussed with:   Anesthesia Plan Comments:         Anesthesia Quick Evaluation  

## 2014-04-04 NOTE — Brief Op Note (Signed)
04/04/2014  2:39 PM  PATIENT:  [name] [name]  29 y.o. male  PRE-OPERATIVE DIAGNOSIS:  medial malleolus and distal tibia fracture left, avulsion fracture of the distal fibula lateral malleolus and fracture base of fifth metatarsal  POST-OPERATIVE DIAGNOSIS:  medial malleolus and distal tibia fracture left and as above  PROCEDURE:  Procedure(s) with comments: OPEN REDUCTION INTERNAL FIXATION (ORIF) ANKLE FRACTURE (Left) - 12:00  SURGEON:  Surgeon(s) and Role:    * Darreld McleanWayne Zakyla Tonche, MD - Primary  PHYSICIAN ASSISTANT:   ASSISTANTS: Willernie NationBetty Ashley, RN   ANESTHESIA:   general  EBL:  Total I/O In: 2000 [I.V.:2000] Out: -   BLOOD ADMINISTERED:none  DRAINS: none   LOCAL MEDICATIONS USED:  NONE  SPECIMEN:  No Specimen  DISPOSITION OF SPECIMEN:  N/A  COUNTS:  YES  TOURNIQUET:   Total Tourniquet Time Documented: Thigh (Left) - 50 minutes Total: Thigh (Left) - 50 minutes   DICTATION: .Other Dictation: Dictation Number [id number]  PLAN OF CARE: Admit for overnight observation  PATIENT DISPOSITION:  PACU - hemodynamically stable.   Delay start of Pharmacological VTE agent (>24hrs) due to surgical blood loss or risk of bleeding: no

## 2014-04-04 NOTE — Anesthesia Procedure Notes (Addendum)
Date/Time: 04/04/2014 1:20 PM Performed by: Patrcia DollyMOSES, Keerat Denicola    Procedure Name: MAC Date/Time: 04/04/2014 1:21 PM Performed by: Shary DecampMOSES, Willodean Leven Pre-anesthesia Checklist: Patient identified, Emergency Drugs available, Suction available, Timeout performed and Patient being monitored Patient Re-evaluated:Patient Re-evaluated prior to inductionOxygen Delivery Method: Non-rebreather mask Preoxygenation: Pre-oxygenation with 100% oxygen Intubation Type: IV induction Ventilation: Mask ventilation without difficulty Laryngoscope Size: Mac and 3 Grade View: Grade I Tube type: Oral Tube size: 7.0 mm Number of attempts: 1 Airway Equipment and Method: Stylet Placement Confirmation: ETT inserted through vocal cords under direct vision,  positive ETCO2 and breath sounds checked- equal and bilateral Secured at: 21 cm Tube secured with: Tape Dental Injury: Teeth and Oropharynx as per pre-operative assessment     Anesthesia Procedure Note  Date/Time: 04/04/2014 1:21 PM Performed by: Patrcia DollyMOSES, Callen Vancuren    Procedure Name: LMA Insertion Date/Time: 04/04/2014 1:21 PM Performed by: Patrcia DollyMOSES, Merrell Rettinger Pre-anesthesia Checklist: Patient identified, Timeout performed, Emergency Drugs available, Suction available and Patient being monitored Patient Re-evaluated:Patient Re-evaluated prior to inductionOxygen Delivery Method: Circle system utilized Preoxygenation: Pre-oxygenation with 100% oxygen Intubation Type: IV induction LMA Size: 5.0 Placement Confirmation: positive ETCO2 and breath sounds checked- equal and bilateral Tube secured with: Tape Dental Injury: Teeth and Oropharynx as per pre-operative assessment

## 2014-04-05 ENCOUNTER — Encounter (HOSPITAL_COMMUNITY): Payer: Self-pay | Admitting: Orthopaedic Surgery

## 2014-04-05 MED ORDER — OXYCODONE-ACETAMINOPHEN 5-325 MG PO TABS
1.0000 | ORAL_TABLET | ORAL | Status: DC | PRN
  Administered 2014-04-05: 2 via ORAL
  Filled 2014-04-05: qty 2

## 2014-04-05 MED ORDER — OXYCODONE-ACETAMINOPHEN 7.5-325 MG PO TABS: 1.0000 | ORAL_TABLET | ORAL | Status: DC | PRN

## 2014-04-05 NOTE — Progress Notes (Signed)
UR chart review completed.  

## 2014-04-05 NOTE — Evaluation (Signed)
Physical Therapy Evaluation Patient Details Name: [name] [name] MRN: [id number] DOB: May 23, 1985 Today's Date: 04/05/2014   History of Present Illness  He stepped into a deep hole around 6 this morning and hurt his left ankle. Pain continued and he came to ER. X-rays show a fracture of the medial malleolus side of the left ankle with fracture also of distal medial tibia. He has avulsion fracture of the lateral malleolus and a nondisplaced fracture of the base of the fifth metatarsal. He has no other injuries.  He underwent OTIF of the left ankle/tibia yesterday and is now in a posterior resting splint.  He is allowed to be TTWB [name] at this time.  He lives with spouse and can stay on the 1st floor of his apartment.  Clinical Impression   Pt was seen for gait training with crutches prior to discharge.  He has had practice on crutches in the interim between accident and surgery.  He has been NWB on crutches.  The crutches were adjusted to fit properly and he was instructed in TTWB gait.  He was very afraid of placing the left foot on the floor and preferred to maintain NWB gait.  He is able to walk a functional distance.  He was instructed in gait on steps but had great difficulty managing this. He has decided to sleep on the 1st floor of his apartment and forego the steps.  He was instructed in safety around the home when using crutches.  No further care should be needed.    Follow Up Recommendations No PT follow up    Equipment Recommendations  None recommended by PT    Recommendations for Other Services   none    Precautions / Restrictions Precautions Precautions: Fall Restrictions Weight Bearing Restrictions: Yes LLE Weight Bearing: Touchdown weight bearing      Mobility  Bed Mobility Overal bed mobility: Modified Independent                Transfers Overall transfer level: Modified independent Equipment used: Crutches                 Ambulation/Gait Ambulation/Gait assistance: Modified independent (Device/Increase time) Ambulation Distance (Feet): 30 Feet Assistive device: Crutches Gait Pattern/deviations: WFL(Within Functional Limits)        Stairs Stairs: Yes Stairs assistance: Supervision Stair Management: One rail Left;With crutches;Forwards Number of Stairs: 2 General stair comments: stairs are extremely difficult for pt to ascend...he has decided to live on the 1st floor of apartment until he can manage his steps  Wheelchair Mobility    Modified Rankin (Stroke Patients Only)       Balance Overall balance assessment: No apparent balance deficits (not formally assessed)                                           Pertinent Vitals/Pain Pain Assessment: No/denies pain (recently had morphine)    Home Living Family/patient expects to be discharged to:: Private residence Living Arrangements: Spouse/significant other Available Help at Discharge: Family;Available 24 hours/day Type of Home: Apartment Home Access: Level entry     Home Layout: Two level;Able to live on main level with bedroom/bathroom Home Equipment: Crutches      Prior Function Level of Independence: Independent               Hand Dominance        Extremity/Trunk Assessment  Lower Extremity Assessment: Overall WFL for tasks assessed;LLE deficits/detail   LLE Deficits / Details: left ankle/foot in posterior resting splint     Communication   Communication: No difficulties  Cognition Arousal/Alertness: Awake/alert Behavior During Therapy: WFL for tasks assessed/performed Overall Cognitive Status: Within Functional Limits for tasks assessed                      General Comments      Exercises        Assessment/Plan    PT Assessment Patent does not need any further PT services  PT Diagnosis     PT Problem List    PT Treatment Interventions     PT Goals  (Current goals can be found in the Care Plan section) Acute Rehab PT Goals PT Goal Formulation: All assessment and education complete, DC therapy    Frequency     Barriers to discharge  none      Co-evaluation               End of Session Equipment Utilized During Treatment: Gait belt Activity Tolerance: Patient tolerated treatment well Patient left: in chair;with call bell/phone within reach Nurse Communication: Mobility status         Time: [id number] PT Time Calculation (min) (ACUTE ONLY): 20 min   Charges:   PT Evaluation $Initial PT Evaluation Tier I: 1 Procedure     PT G CodesMyrlene [name]:        [name] [name] 04/05/2014, 9:17 AM

## 2014-04-05 NOTE — Progress Notes (Signed)
Patient discharged home today.  Patient was given discharge instructions, prescriptions, and care notes.  Patient verbalized understanding with no complaints or concerns voiced at this time.  IV was removed with catheter intact, no bleeding or complications.  Patient left unit in stable condition by a staff member in a wheelchair. 

## 2014-04-05 NOTE — Discharge Instructions (Signed)
Elevate left foot/ankle.  Use crutches.  Use ice as needed.  Do not walk or bear weight on the cast on the left.  Keep appointment with Dr. Hilda LiasKeeling in two weeks.  Call his office if any problem at [phone] or if after hours, the hospital at [phone].

## 2014-04-05 NOTE — Progress Notes (Signed)
Subjective: 1 Day Post-Op Procedure(s) (LRB): OPEN REDUCTION INTERNAL FIXATION (ORIF) ANKLE FRACTURE (Left) Patient reports pain as 4 on 0-10 scale.    Objective: Vital signs in last 24 hours: Temp:  [98 F (36.7 C)-98.7 F (37.1 C)] 98.5 F (36.9 C) (03/24 0607) Pulse Rate:  [92-108] 102 (03/24 0607) Resp:  [0-31] 18 (03/24 0607) BP: (128-170)/(75-131) 148/103 mmHg (03/24 0607) SpO2:  [93 %-100 %] 98 % (03/24 0607) Weight:  [115.667 kg (255 lb)] 115.667 kg (255 lb) (03/23 1628)  Intake/Output from previous day: 03/23 0701 - 03/24 0700 In: 3172 [P.O.:222; I.V.:2950] Out: -  Intake/Output this shift:     Recent Labs  04/02/14 1529  HGB 15.4    Recent Labs  04/02/14 1529  WBC 10.3  RBC 5.25  HCT 44.3  PLT 213    Recent Labs  04/02/14 1529  NA 139  K 4.1  CL 104  CO2 28  BUN 13  CREATININE 1.09  GLUCOSE 102*  CALCIUM 9.4   No results for input(s): LABPT, INR in the last 72 hours.  Neurologically intact Neurovascular intact Sensation intact distally Intact pulses distally   He had restless night but pain controlled.  To have PT today then discharge home.  Assessment/Plan: 1 Day Post-Op Procedure(s) (LRB): OPEN REDUCTION INTERNAL FIXATION (ORIF) ANKLE FRACTURE (Left) Up with therapy then discharge home  [name] [name] 04/05/2014, 7:23 AM

## 2014-04-05 NOTE — Discharge Summary (Signed)
Physician Discharge Summary  Patient ID: [name] [name] MRN: [id number] DOB/AGE: 1985-01-19 29 y.o.  Admit date: 04/04/2014 Discharge date: 04/05/2014  Admission Diagnoses: Fracture left distal tibia and medial malleolus on the left; avulsion fracture of the lateral malleolus on the left; fracture base of fifth metatarsal on the left nondisplaced.  Discharge Diagnoses: same Active Problems:   Bimalleolar fracture of left ankle   Discharged Condition: good  Hospital Course: He was admitted to observation after surgery on the left ankle.  He has had pain controlled.  Neurovascular status remained normal.  He will be seen by physical therapy prior to discharge and will have oral pain medicine.  He  Understands not to bear weight on the left cast or foot.  His vital signs have remained normal.  Consults: None  Significant Diagnostic Studies: labs: normal and radiology: showing reduction of fracture at surgery.  Treatments: surgery: open treatment and internal reduction of the left distal tibia and left medial malleolus.  Discharge Exam: Blood pressure 148/103, pulse 102, temperature 98.5 F (36.9 C), temperature source Oral, resp. rate 18, height 5\' 11"  (1.803 m), weight 115.667 kg (255 lb), SpO2 98 %. General appearance: alert and cooperative.  Neurovascular intact.  Splint intact of the left lower leg.   Disposition: 01-Home or Self Care     Medication List    STOP taking these medications        acetaminophen 500 MG tablet  Commonly known as:  TYLENOL     oxyCODONE-acetaminophen 5-325 MG per tablet  Commonly known as:  PERCOCET/ROXICET  Replaced by:  oxyCODONE-acetaminophen 7.5-325 MG per tablet      TAKE these medications        oxyCODONE-acetaminophen 7.5-325 MG per tablet  Commonly known as:  PERCOCET  Take 1 tablet by mouth every 4 (four) hours as needed for pain.           Follow-up Information    Follow up with Darreld McleanKEELING,Kiarah Eckstein, MD In 2 weeks.   Specialty:  Orthopedic Surgery   Contact information:   [address] [place] [id number] [phone]       Signed: Darreld McleanKEELING,Koralee Wedeking 04/05/2014, 7:31 AM

## 2014-04-05 NOTE — Op Note (Signed)
NAMMarquette Old:  [name], [name]         ACCOUNT NO.:  [id number]  MEDICAL RECORD NO.:  [id number]  LOCATION:  A325                          FACILITY:  [facility]  PHYSICIAN:  J. Darreld McleanWayne Reyce [name], M.D. DATE OF BIRTH:  01/07/1986  DATE OF PROCEDURE: DATE OF DISCHARGE:                              OPERATIVE REPORT   PREOPERATIVE DIAGNOSES:  Fracture of the medial malleolus and distal tibia on the left ankle, avulsion fracture of the lateral malleolus of the left ankle and fracture of the fifth metatarsal.  POSTOPERATIVE DIAGNOSES:  Fracture of the medial malleolus and distal tibia on the left ankle, avulsion fracture of the lateral malleolus of the left ankle and fracture of the fifth metatarsal.  PROCEDURE:  Open reduction and internal fixation of left ankle fracture.  ANESTHESIA:  General.  TOURNIQUET TIME:  50 minutes.  DRAINS:  No drains.  IMPLANT:  The posterior splint applied at the end of the procedure.  SURGEON:  J. Darreld McleanWayne [name] [name], M.D.  ASSISTANT:  [name] NationBetty Ashley, RN  INDICATIONS:  The patient stepped in a hole two days ago and injured his left ankle.  Sustained the above-mentioned injury.  He was seen in the emergency room by me, put in a posterior splint and set up for surgery today.  I explained the risk and imponderables of the procedure with the patient and he appeared to understand.  He asked appropriate questions. He understands he will be in a cast or a splint of some type for about 6- 8 weeks and need physical therapy.  I have to explain to him about traumatic arthritis over time and his ankle could bother him in the future.  He was given pain medicine in the ER to last until today.  I told him he will be placed in the hospital overnight for observation and hopefully go home tomorrow or certainly by the second day.  Again, he appeared to understand and agreed to the procedure as outlined.  DESCRIPTION OF PROCEDURE:  The patient was seen in the holding area and the left  ankle was identified as a correct surgical site.  I placed a mark on the left ankle.  The patient was brought to the operating room and given general anesthesia while supine on the operating room table. Tourniquet was placed and deflated the left upper thigh.  He previously had been given Ancef.  He was prepped and draped.  We had a time-out. We identified the patient as Mr. [name] [name], we were doing his left ankle for fracture of his medial side.  Everyone on the room knew each other.  The C-arm fluoroscopy unit was working and everyone had on aprons, badges and thyroid shield.  All instrumentations were properly positioned and working.  The leg was then elevated, wrapped circumferentially with an Esmarch bandage.  Esmarch bandage was removed. Incision was made medially and the fracture was identified.  He had broken off a small piece of the articular surface and this was brought in and reduced.  The patient had a fracture extending up vertically on the tibia.  I placed a smooth 0.62 Kirschner wire to hold the fracture in place and then placed another small 0.32 Kirschner wire.  X-rays looked good.  I  then placed a medial malleolar screw 55 mm after using a 3.2 drill bit.  This had gave more stabilization.  Then, I placed a 2.7 drill hole next to the previously applied small Kirschner wire and placed a 36-mm long cortical screw with a washer to hold the most proximal portion of the fractured tibia in place.  Then, I placed another smooth Kirschner wire parallel to the joint and then made another drill hole with a 2.7 drill and placed a 3.5-mm x 16-mm long cortical screw with a washer.  This gave excellent compression to the fracture site.  Then, I removed the malleolar screw that I previously applied, that was giving me good fixation and traction more of an oblique angle, but I did not need this anymore and I removed that.  The previously applied Kirschner wire had been removed.   Permanent x-rays were taken and showed good reduction of the fracture.  The small fragment that he had appeared to be in good position.  The wounds were then reapproximated using 2-0 chromic interrupted vertical mattress manner.  The subcutaneous tissue was reapproximated using 2-0 plain and then staples on the skin were used.  The wound was washed with peroxide. Sterile dressing applied, bulky dressing applied with ABDs and then a posterior splint applied.  Tourniquet deflated after 50 minutes.  The patient tolerated the procedure well, will go to recovery in good condition and we placed an observation admission tonight.          ______________________________ [name] [name], M.D.     JWK/MEDQ  D:  04/04/2014  T:  04/05/2014  Job:  [id number]  cc:   [name] [name], P.A.

## 2014-05-17 ENCOUNTER — Other Ambulatory Visit (HOSPITAL_COMMUNITY): Payer: Self-pay | Admitting: Orthopaedic Surgery

## 2014-05-17 ENCOUNTER — Ambulatory Visit (HOSPITAL_COMMUNITY)
Admission: RE | Admit: 2014-05-17 | Discharge: 2014-05-17 | Disposition: A | Discharge status: Home / Self Care | Payer: Self-pay | Source: Ambulatory Visit | Attending: Orthopaedic Surgery | Admitting: Orthopaedic Surgery

## 2014-05-17 DIAGNOSIS — T148XXA Other injury of unspecified body region, initial encounter: Secondary | ICD-10-CM

## 2014-05-17 DIAGNOSIS — S8252XD Displaced fracture of medial malleolus of left tibia, subsequent encounter for closed fracture with routine healing: Secondary | ICD-10-CM | POA: Insufficient documentation

## 2014-05-17 DIAGNOSIS — X58XXXD Exposure to other specified factors, subsequent encounter: Secondary | ICD-10-CM | POA: Insufficient documentation

## 2014-06-22 ENCOUNTER — Ambulatory Visit (HOSPITAL_COMMUNITY)
Admission: RE | Admit: 2014-06-22 | Discharge: 2014-06-22 | Disposition: A | Discharge status: Home / Self Care | Payer: Self-pay | Source: Ambulatory Visit | Attending: Orthopaedic Surgery | Admitting: Orthopaedic Surgery

## 2014-06-22 ENCOUNTER — Other Ambulatory Visit (HOSPITAL_COMMUNITY): Payer: Self-pay | Admitting: Orthopaedic Surgery

## 2014-06-22 DIAGNOSIS — S82202A Unspecified fracture of shaft of left tibia, initial encounter for closed fracture: Secondary | ICD-10-CM

## 2014-06-22 DIAGNOSIS — S82202D Unspecified fracture of shaft of left tibia, subsequent encounter for closed fracture with routine healing: Secondary | ICD-10-CM | POA: Insufficient documentation

## 2014-08-20 ENCOUNTER — Other Ambulatory Visit (HOSPITAL_COMMUNITY): Payer: Self-pay | Admitting: Orthopaedic Surgery

## 2014-08-20 ENCOUNTER — Ambulatory Visit (HOSPITAL_COMMUNITY)
Admission: RE | Admit: 2014-08-20 | Discharge: 2014-08-20 | Disposition: A | Discharge status: Home / Self Care | Payer: Self-pay | Source: Ambulatory Visit | Attending: Orthopaedic Surgery | Admitting: Orthopaedic Surgery

## 2014-08-20 DIAGNOSIS — X58XXXD Exposure to other specified factors, subsequent encounter: Secondary | ICD-10-CM | POA: Insufficient documentation

## 2014-08-20 DIAGNOSIS — S8252XD Displaced fracture of medial malleolus of left tibia, subsequent encounter for closed fracture with routine healing: Secondary | ICD-10-CM | POA: Insufficient documentation

## 2014-08-20 DIAGNOSIS — T148XXA Other injury of unspecified body region, initial encounter: Secondary | ICD-10-CM

## 2015-02-28 ENCOUNTER — Telehealth: Payer: Self-pay | Admitting: Orthopaedic Surgery

## 2015-02-28 MED ORDER — HYDROCODONE-ACETAMINOPHEN 7.5-325 MG PO TABS: 1.0000 | ORAL_TABLET | ORAL | Status: DC | PRN

## 2015-02-28 NOTE — Telephone Encounter (Signed)
Patient is requesting Hydrocodone 7.5/325mg  Qty 120 Tablets

## 2015-02-28 NOTE — Telephone Encounter (Signed)
Patient made aware his prescription will be available for pick up Tuesday morning after Dr. Hilda Lias signs it.

## 2015-02-28 NOTE — Telephone Encounter (Signed)
Rx printed

## 2015-04-02 ENCOUNTER — Telehealth: Payer: Self-pay | Admitting: Orthopaedic Surgery

## 2015-04-02 MED ORDER — ACETAMINOPHEN-CODEINE #3 300-30 MG PO TABS: ORAL_TABLET | ORAL | Status: AC

## 2015-04-02 NOTE — Telephone Encounter (Signed)
Rx printed

## 2015-04-02 NOTE — Telephone Encounter (Signed)
Call received from patient for refill of medication: HYDROcodone-acetaminophen (NORCO) 7.5-325 MG tablet [[id number]][[id number]] quantity 120 - his phone # is [phone]

## 2016-10-08 IMAGING — DX DG ANKLE COMPLETE 3+V*L*
3 series · 3 of 3 positions shown · non-contrast
Comparison: In plaster images May 17, 2014 and intraoperative
fluoro spot images April 04, 2014.

CLINICAL DATA: Status post fall in [REDACTED] with subsequent ORIF for
distal tibial fracture. Evaluate healing

EXAM:
LEFT ANKLE COMPLETE - 3+ VIEW

[ankle ap]
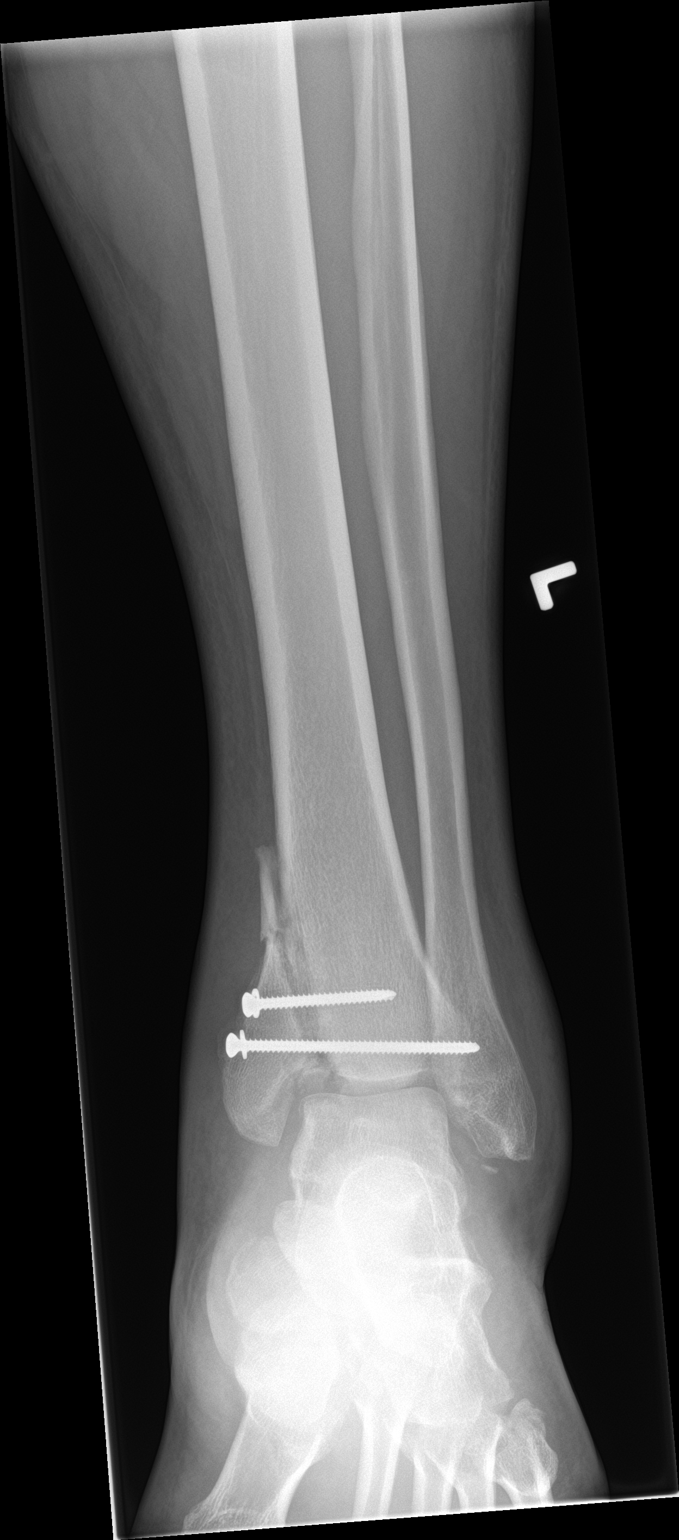

[ankle obl]
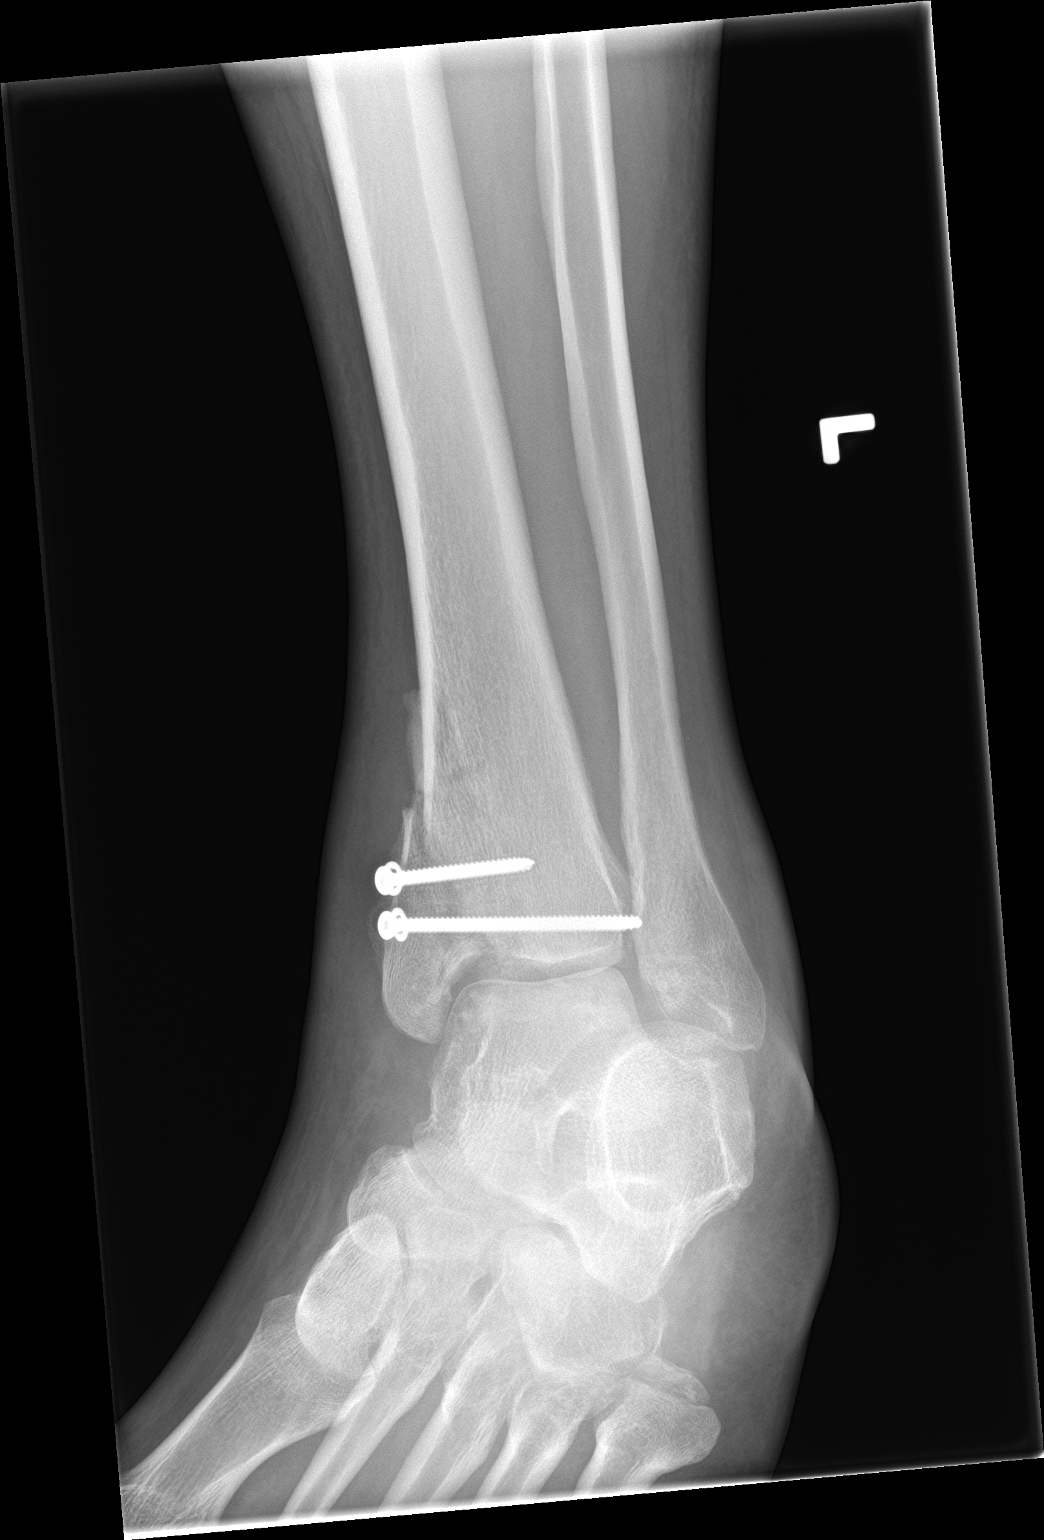

[ankle lat]
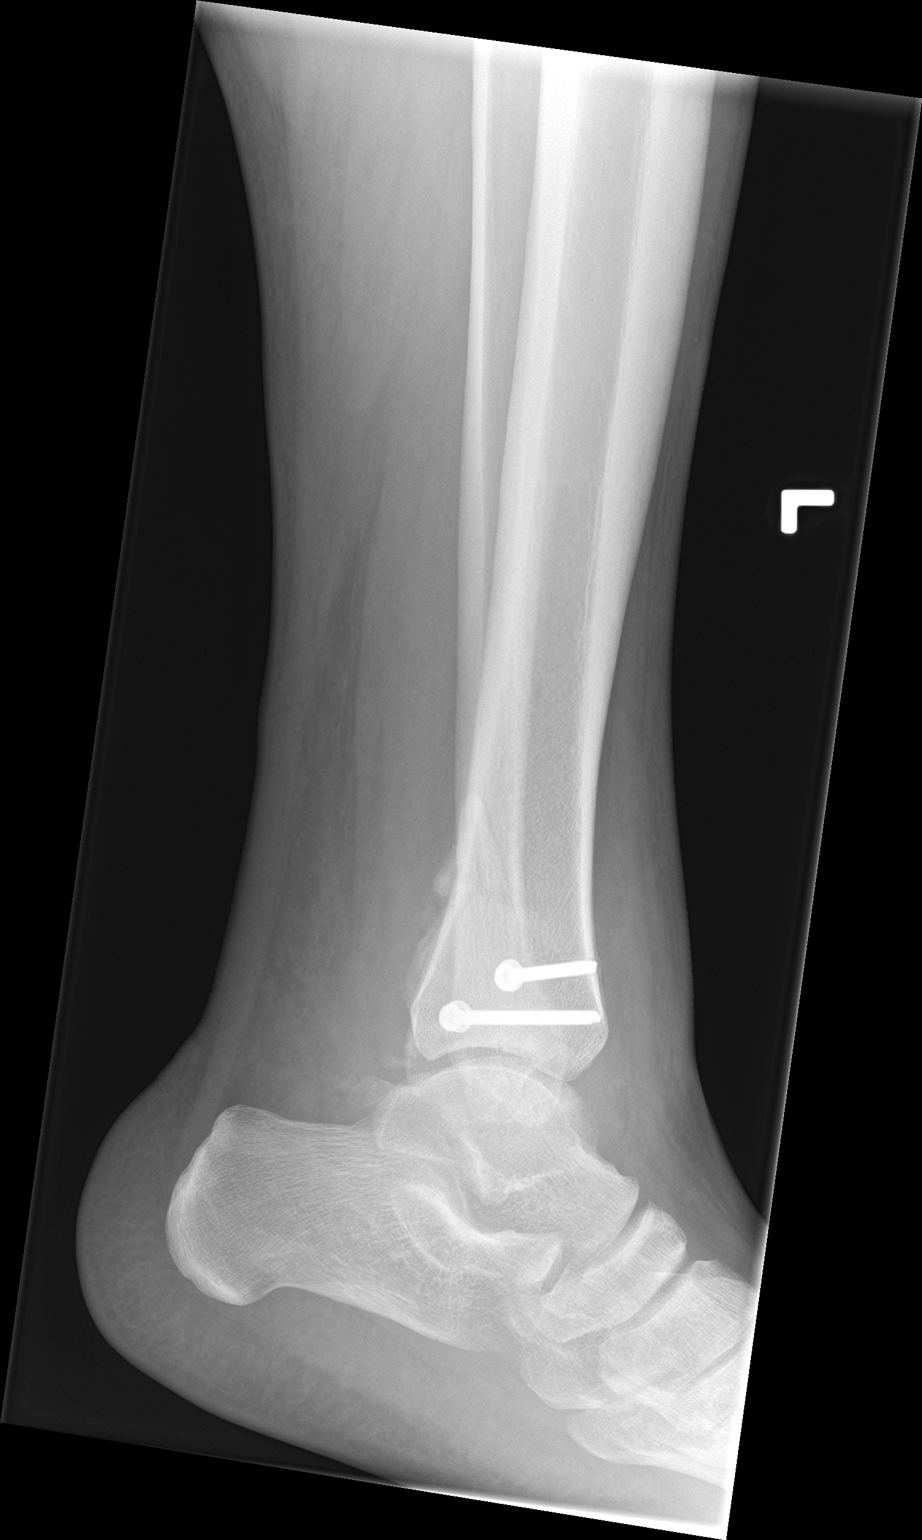

[3 of 3 positions shown; findings below may reference images not displayed]

FINDINGS: Again demonstrated is a vertically-oriented comminuted fracture of
the medial malleolus. The fracture line extends from the distal
tibial metaphysis inferiorly to the tibiotalar joint. There is
stable mild distraction of the fracture fragments. The fixation
screws are intact and unchanged in position. There is no significant
periosteal reaction. The avulsion of the tip of the lateral
malleolus is unchanged.

There is an ununited fracture through the base of the fifth
metatarsal.
IMPRESSION: There has not been significant interval change in the appearance of
the distal tibial fracture, the avulsion fracture from the tip of
the lateral malleolus, or the fifth metatarsal base fracture. No
significant periosteal reaction is demonstrated.

## 2016-12-06 IMAGING — DX DG ANKLE COMPLETE 3+V*L*
3 series · 3 of 3 positions shown · non-contrast
Comparison: 06/22/2014 .

CLINICAL DATA: Ankle fracture.

EXAM:
LEFT ANKLE COMPLETE - 3+ VIEW

[ankle ap]
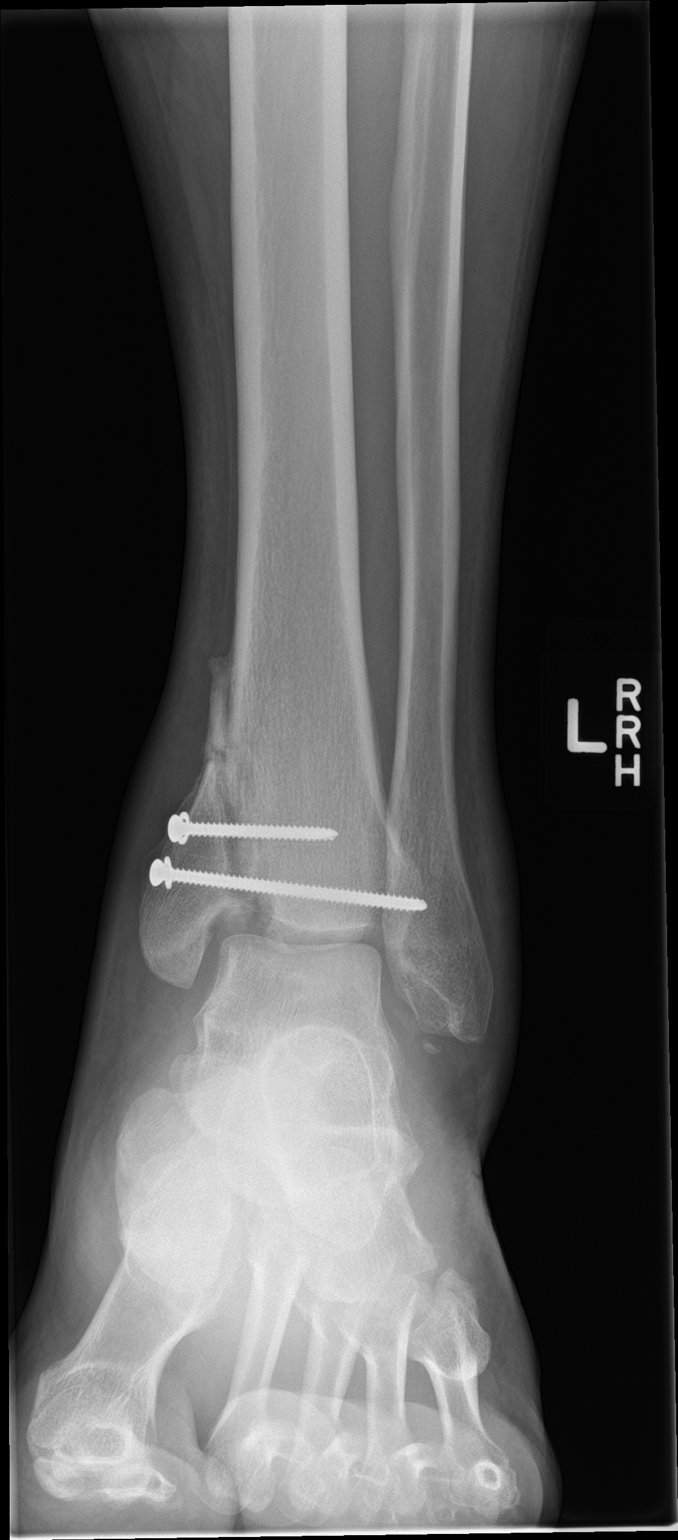

[ankle obl]
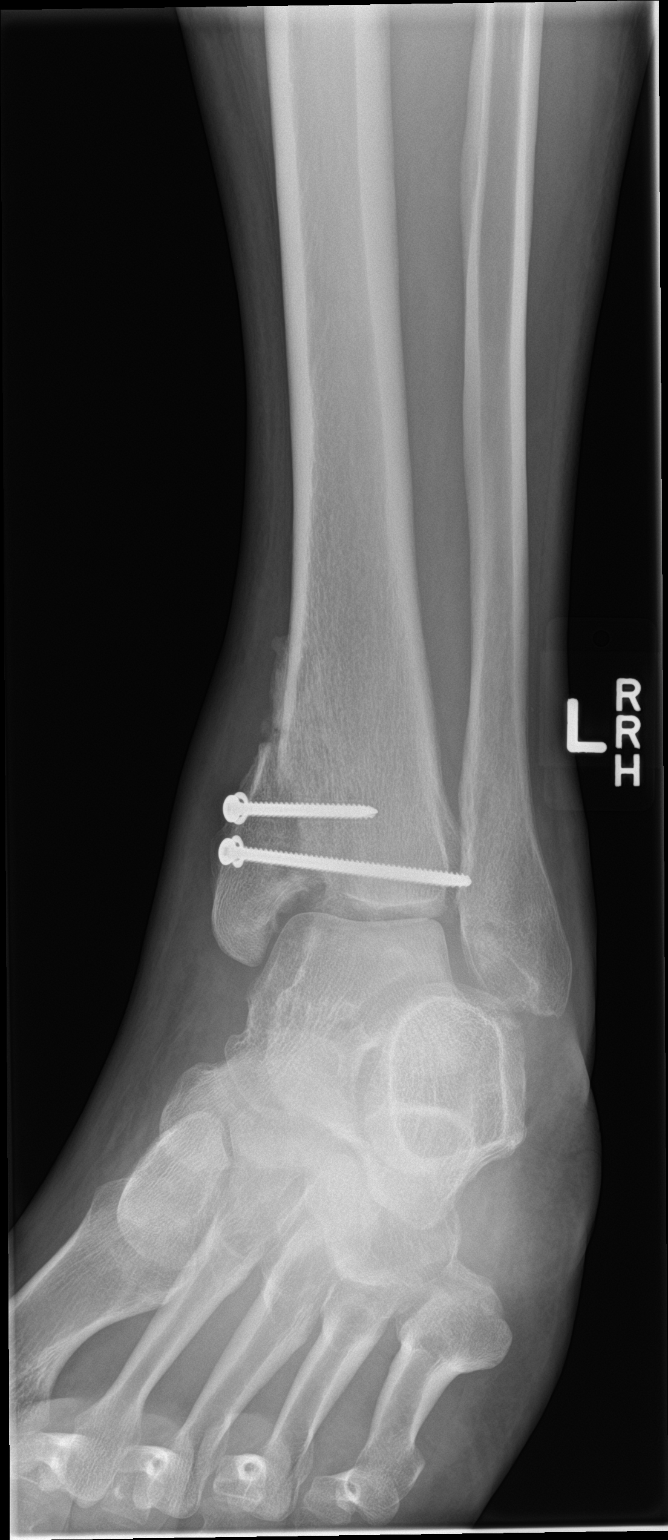

[ankle lat]
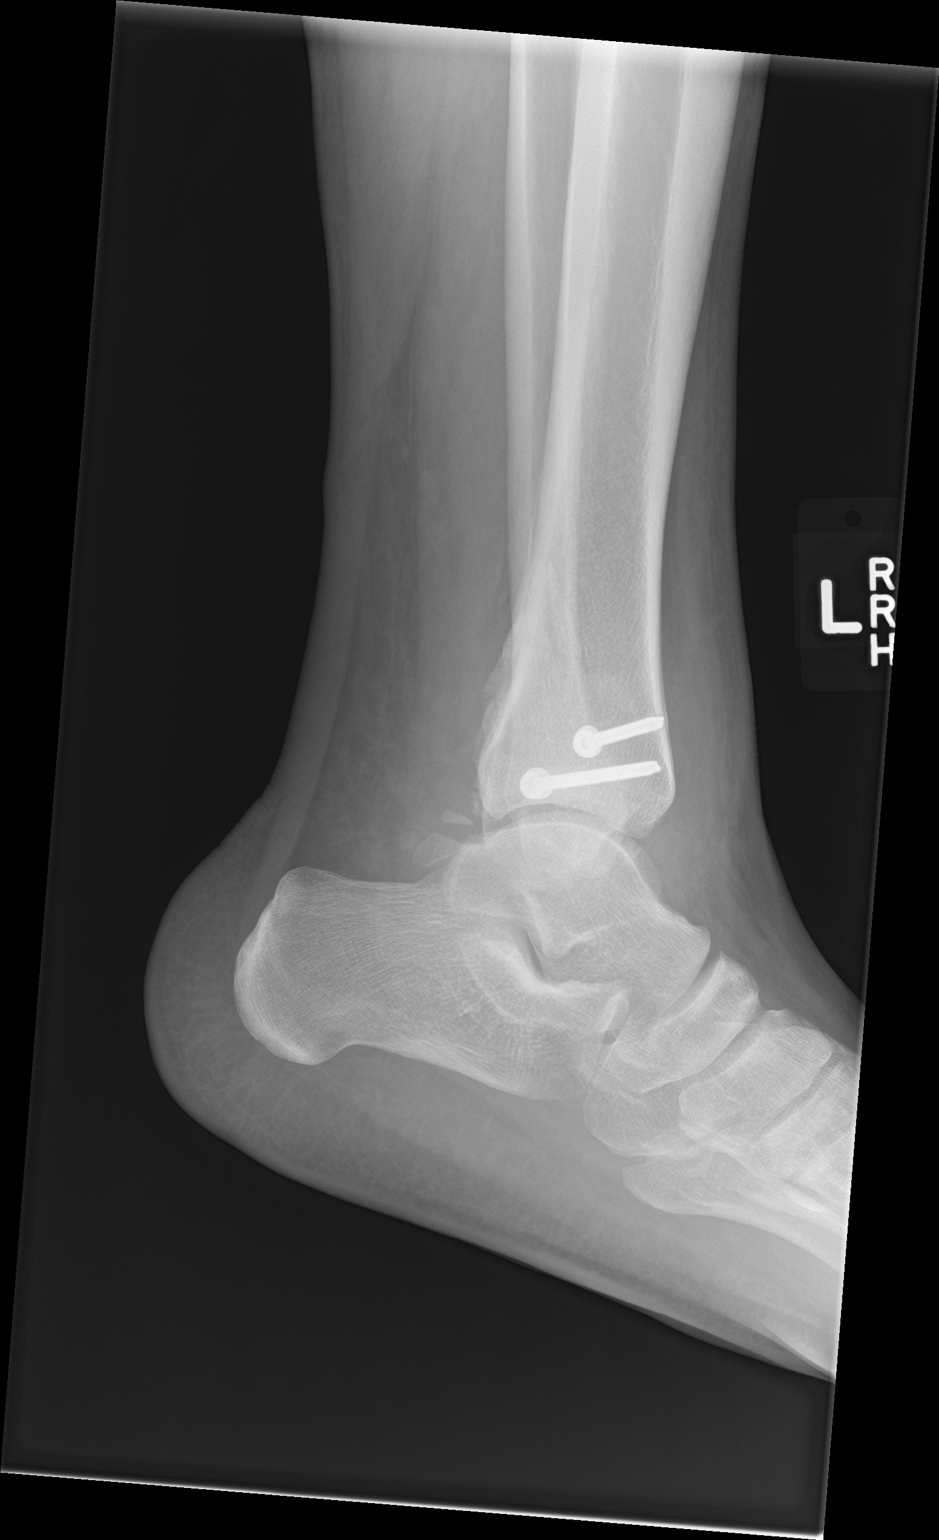

[3 of 3 positions shown; findings below may reference images not displayed]

FINDINGS: Patient status post open reduction internal fixation of left medial
malleolar fracture. The fracture appears stable. Minimal amount of
callus noted. Significant interim change. No new abnormality.
IMPRESSION: Patient status post open reduction internal fixation of left medial
malleolar fracture. Minimal amount of callus noted, otherwise the
fracture appears unchanged from 06/22/2014 .
